# Patient Record
Sex: Male | Born: 1960 | Race: White | Hispanic: No | Marital: Married | Smoking: Never smoker
Health system: Southern US, Community
[De-identification: ages and names within clinical notes are randomized; demographics above are authoritative.]

## PROBLEM LIST (undated history)

## (undated) DIAGNOSIS — E785 Hyperlipidemia, unspecified: Secondary | ICD-10-CM

## (undated) DIAGNOSIS — N2889 Other specified disorders of kidney and ureter: Secondary | ICD-10-CM

## (undated) DIAGNOSIS — G4730 Sleep apnea, unspecified: Secondary | ICD-10-CM

## (undated) HISTORY — PX: VASECTOMY: SHX75

---

## 2012-03-29 ENCOUNTER — Other Ambulatory Visit (HOSPITAL_COMMUNITY): Payer: Self-pay | Admitting: Urology

## 2012-03-29 ENCOUNTER — Ambulatory Visit (HOSPITAL_COMMUNITY)
Admission: RE | Admit: 2012-03-29 | Discharge: 2012-03-29 | Disposition: A | Discharge status: Home / Self Care | Payer: BC Managed Care – PPO | Source: Ambulatory Visit | Attending: Urology | Admitting: Urology

## 2012-03-29 DIAGNOSIS — C649 Malignant neoplasm of unspecified kidney, except renal pelvis: Secondary | ICD-10-CM

## 2012-03-30 ENCOUNTER — Other Ambulatory Visit: Payer: Self-pay | Admitting: Urology

## 2012-04-28 ENCOUNTER — Encounter (HOSPITAL_COMMUNITY): Payer: Self-pay | Admitting: Pharmacy Technician

## 2012-05-02 NOTE — Pre-Procedure Instructions (Signed)
05-03-12 EKG 11'13-report with chart/ CXR 1'14 -Epic. W. Kennon Portela

## 2012-05-02 NOTE — Patient Instructions (Addendum)
20 [name]  05/02/2012   Your procedure is scheduled on:  3-13 -2014  Report to [facility] at   0800     AM.  Call this number if you have problems the morning of surgery: [phone]  Or Presurgical Testing [phone]([place])   Remember: Follow any bowel prep instructions per MD office.( Drink clear liquids plentiful) For Cpap use: Bring mask and tubing only.   Do not eat food:After Midnight.-05-10-12.    Clear liquids include soda, tea, black coffee, apple or grape juice, broth.(stop after midnight night befor e surgery).  Take these medicines the morning of surgery with A SIP OF WATER: None   Do not wear jewelry, make-up or nail polish.  Do not wear lotions, powders, or perfumes. You may wear deodorant.  Do not shave 12 hours prior to first CHG shower(legs and under arms).(face and neck okay.)  Do not bring valuables to the hospital.  Contacts, dentures or bridgework,body piercing,  may not be worn into surgery.  Leave suitcase in the car. After surgery it may be brought to your room.  For patients admitted to the hospital, checkout time is 11:00 AM the day of discharge.   Patients discharged the day of surgery will not be allowed to drive home. Must have responsible person with you x 24 hours once discharged.  Name and phone number of your driver: [phone] cell -Kelly-spouse  Special Instructions: CHG(Chlorhedine 4%-"Hibiclens","Betasept","Aplicare") Shower Use Special Wash: see special instructions.(avoid face and genitals)   Please read over the following fact sheets that you were given: MRSA Information, Blood Transfusion fact sheet, Incentive Spirometry Instruction.    Failure to follow these instructions may result in Cancellation of your surgery.   Patient signature_______________________________________________________    `,m

## 2012-05-03 ENCOUNTER — Encounter (HOSPITAL_COMMUNITY): Payer: Self-pay

## 2012-05-03 ENCOUNTER — Encounter (HOSPITAL_COMMUNITY)
Admission: RE | Admit: 2012-05-03 | Discharge: 2012-05-03 | Disposition: A | Discharge status: Home / Self Care | Payer: BC Managed Care – PPO | Source: Ambulatory Visit | Attending: Urology | Admitting: Urology

## 2012-05-03 DIAGNOSIS — G4730 Sleep apnea, unspecified: Secondary | ICD-10-CM

## 2012-05-03 HISTORY — DX: Hyperlipidemia, unspecified: E78.5

## 2012-05-03 HISTORY — DX: Sleep apnea, unspecified: G47.30

## 2012-05-03 HISTORY — DX: Other specified disorders of kidney and ureter: N28.89

## 2012-05-03 HISTORY — PX: ARTHROSCOPIC REPAIR ACL: SUR80

## 2012-05-03 HISTORY — PX: HERNIA REPAIR: SHX51

## 2012-05-03 LAB — CBC
HCT: 46 % (ref 39.0–52.0)
MCH: 31.4 pg (ref 26.0–34.0)
MCHC: 34.6 g/dL (ref 30.0–36.0)
MCV: 90.7 fL (ref 78.0–100.0)
Platelets: 235 10*3/uL (ref 150–400)
RDW: 13.2 % (ref 11.5–15.5)
WBC: 6.1 10*3/uL (ref 4.0–10.5)

## 2012-05-03 LAB — SURGICAL PCR SCREEN: Staphylococcus aureus: NEGATIVE

## 2012-05-03 LAB — BASIC METABOLIC PANEL
BUN: 13 mg/dL (ref 6–23)
CO2: 25 mEq/L (ref 19–32)
Calcium: 8.9 mg/dL (ref 8.4–10.5)
Chloride: 106 mEq/L (ref 96–112)
Creatinine, Ser: 0.95 mg/dL (ref 0.50–1.35)
Glucose, Bld: 89 mg/dL (ref 70–99)

## 2012-05-04 ENCOUNTER — Encounter (HOSPITAL_COMMUNITY): Payer: Self-pay

## 2012-05-11 NOTE — H&P (Signed)
Chief Complaint  Left renal neoplasm concerning for malignancy     History of Present Illness [name] is a 52 year old gentleman who is noted to have microscopic hematuria by Dr. Johnella Moloney prompting a urologic evaluation by Dr. Isabel Caprice in December. He has no history of gross hematuria and no history of tobacco use. He does have a family history of prostate cancer and apparently has been scheduled to undergo PSA testing by Dr. Isabel Caprice.   He did undergo a hematuria evaluation including a CT scan of the abdomen and pelvis with and without contrast. This demonstrated a 3 cm enhancing mass off the upper pole of the left kidney which was partially exophytic although mostly endophytic. This is suspicious for renal cell carcinoma until proven otherwise. There is no regional lymphadenopathy, no contralateral concerning renal masses, no adrenal abnormalities, no renal vein involvement, and no evidence of abdominal metastases. He has no family history of kidney cancer.  He has otherwise denied any dysuria, gross hematuria, urgency, frequency, history of GU malignancy/trauma/surgery. There is no family history of end-stage renal disease.   Past Medical History Problems  1. History of  Adult Sleep Apnea 780.57 2. History of  Hypercholesterolemia 272.0  Surgical History Problems  1. History of  Knee Surgery Left 2. History of  Laparoscopy Repair Of Hernia  Current Meds 1. Crestor TABS; Therapy: (Recorded:27Dec2013) to  Allergies Medication  1. No Known Drug Allergies  Family History Problems  1. Family history of  Father Deceased At Age 55 2. Family history of  Mother Deceased At Age 32 3. Family history of  Prostate Cancer V16.42  Social History Problems    Alcohol Use 1/day   Marital History - Currently Married   Never A Smoker  Review of Systems Constitutional, skin, eye, otolaryngeal, hematologic/lymphatic, cardiovascular, pulmonary, endocrine, musculoskeletal,  gastrointestinal, neurological and psychiatric system(s) were reviewed and pertinent findings if present are noted.      Physical Exam Constitutional: Well nourished and well developed . No acute distress.  ENT:. The ears and nose are normal in appearance.  Neck: The appearance of the neck is normal and no neck mass is present.  Pulmonary: No respiratory distress, normal respiratory rhythm and effort and clear bilateral breath sounds.  Cardiovascular: Heart rate and rhythm are normal . No peripheral edema.  Abdomen: The abdomen is soft and nontender. No masses are palpated. No CVA tenderness. No hernias are palpable.  An umbilical hernia is present, which is reducible. No hepatosplenomegaly noted.  Lymphatics: The supraclavicular, femoral and inguinal nodes are not enlarged or tender.  Skin: Normal skin turgor, no visible rash and no visible skin lesions.  Neuro/Psych:. Mood and affect are appropriate.     AU CT-HEMATURIA PROTOCOL 16Jan2014 12:00AM [name]   Test Name Result Flag Reference  ** RADIOLOGY REPORT BY [facility] **   *RADIOLOGY REPORT*  Clinical Data: Micro hematuria.  CT ABDOMEN AND PELVIS WITHOUT AND WITH CONTRAST  Technique: Multidetector CT imaging of the abdomen and pelvis was performed without contrast material in one or both body regions, followed by contrast material(s) and further sections in one or both body regions.  Contrast: 125 ml Isovue 300  Comparison: None.  Findings: No evidence of renal calculi or hydronephrosis. No evidence of ureteral calculi or dilatation. No bladder calculi identified.  Multi phasic postcontrast imaging shows a solid hypervascular mass in the upper pole of the left kidney which measures 3.0 cm, and shows no evidence of macroscopic fat. This is consistent  with a renal cell carcinoma. No other renal masses are identified. A simple small parapelvic cyst is noted the right kidney. Excretory phase shows  no masses or filling defects involving the intrarenal collecting systems, ureters, or urinary bladder.  No evidence of renal vein or IVC thrombus. No evidence of retroperitoneal lymphadenopathy. No adenopathy seen elsewhere within the abdomen or pelvis.  The liver, gallbladder, pancreas, spleen, and both adrenal glands are normal in appearance. Prostate and seminal vesicles are unremarkable. Surgical mesh seen in the lower anterior abdominal wall and suprapubic soft tissues. No evidence of inflammatory process or abnormal fluid collections. No evidence of recurrent hernia or dilated bowel loops. No suspicious bone lesions are identified. Visualized portions of lung bases are clear.  IMPRESSION:  1. 3.0 cm hypervascular mass in upper pole left kidney, consistent with renal cell carcinoma. Surgical evaluation is recommended. 2. No evidence of metastatic disease or urolithiasis.   Original Report Authenticated By: Myles Rosenthal, M.D.     Assessment Assessed  1. 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 Microscopic Hematuria 599.72   Discussion/Summary  1. Left renal neoplasm concerning for malignancy and microscopic hematuria:  He will undergo cystoscopy and a left robotic partial nephrectomy. I discussed the potential benefits and risks of the procedure, side effects of the proposed treatment, the likelihood of the patient achieving the goals of the procedure, and any potential problems that might occur during the procedure or recuperation.

## 2012-05-12 ENCOUNTER — Ambulatory Visit (HOSPITAL_COMMUNITY): Payer: BC Managed Care – PPO | Admitting: Registered Nurse

## 2012-05-12 ENCOUNTER — Inpatient Hospital Stay (HOSPITAL_COMMUNITY)
Admission: RE | Admit: 2012-05-12 | Discharge: 2012-05-14 | DRG: 303 | Disposition: A | Discharge status: Home / Self Care | Payer: BC Managed Care – PPO | Source: Ambulatory Visit | Attending: Urology | Admitting: Urology

## 2012-05-12 ENCOUNTER — Encounter (HOSPITAL_COMMUNITY): Payer: Self-pay | Admitting: Registered Nurse

## 2012-05-12 ENCOUNTER — Encounter (HOSPITAL_COMMUNITY)
Admission: RE | Disposition: A | Discharge status: Home / Self Care | Payer: Self-pay | Source: Ambulatory Visit | Attending: Urology

## 2012-05-12 ENCOUNTER — Encounter (HOSPITAL_COMMUNITY): Payer: Self-pay | Admitting: *Deleted

## 2012-05-12 DIAGNOSIS — C642 Malignant neoplasm of left kidney, except renal pelvis: Secondary | ICD-10-CM

## 2012-05-12 DIAGNOSIS — G4730 Sleep apnea, unspecified: Secondary | ICD-10-CM | POA: Diagnosis present

## 2012-05-12 DIAGNOSIS — C649 Malignant neoplasm of unspecified kidney, except renal pelvis: Principal | ICD-10-CM | POA: Diagnosis present

## 2012-05-12 DIAGNOSIS — R3129 Other microscopic hematuria: Secondary | ICD-10-CM | POA: Diagnosis present

## 2012-05-12 DIAGNOSIS — K429 Umbilical hernia without obstruction or gangrene: Secondary | ICD-10-CM | POA: Diagnosis present

## 2012-05-12 DIAGNOSIS — Z79899 Other long term (current) drug therapy: Secondary | ICD-10-CM

## 2012-05-12 DIAGNOSIS — E7800 Pure hypercholesterolemia, unspecified: Secondary | ICD-10-CM | POA: Diagnosis present

## 2012-05-12 HISTORY — PX: ROBOTIC ASSITED PARTIAL NEPHRECTOMY: SHX6087

## 2012-05-12 HISTORY — PX: CYSTOSCOPY: SHX5120

## 2012-05-12 LAB — BASIC METABOLIC PANEL
BUN: 12 mg/dL (ref 6–23)
CO2: 27 mEq/L (ref 19–32)
Calcium: 8.2 mg/dL — ABNORMAL LOW (ref 8.4–10.5)
Creatinine, Ser: 1.2 mg/dL (ref 0.50–1.35)

## 2012-05-12 LAB — HEMOGLOBIN AND HEMATOCRIT, BLOOD: Hemoglobin: 14.7 g/dL (ref 13.0–17.0)

## 2012-05-12 SURGERY — ROBOTIC ASSITED PARTIAL NEPHRECTOMY
Anesthesia: General | Wound class: Clean

## 2012-05-12 MED ORDER — NEOSTIGMINE METHYLSULFATE 1 MG/ML IJ SOLN
INTRAMUSCULAR | Status: DC | PRN
  Administered 2012-05-12: 5 mg via INTRAVENOUS

## 2012-05-12 MED ORDER — ZOLPIDEM TARTRATE 5 MG PO TABS
5.0000 mg | ORAL_TABLET | Freq: Every evening | ORAL | Status: DC | PRN
  Administered 2012-05-13: 5 mg via ORAL
  Filled 2012-05-12: qty 1

## 2012-05-12 MED ORDER — LIDOCAINE HCL (CARDIAC) 20 MG/ML IV SOLN
INTRAVENOUS | Status: DC | PRN
  Administered 2012-05-12: 100 mg via INTRAVENOUS

## 2012-05-12 MED ORDER — PROPOFOL 10 MG/ML IV BOLUS
INTRAVENOUS | Status: DC | PRN
  Administered 2012-05-12: 200 mg via INTRAVENOUS

## 2012-05-12 MED ORDER — HYDROMORPHONE HCL PF 1 MG/ML IJ SOLN
0.2500 mg | INTRAMUSCULAR | Status: DC | PRN
  Administered 2012-05-12 (×4): 0.5 mg via INTRAVENOUS

## 2012-05-12 MED ORDER — ONDANSETRON HCL 4 MG/2ML IJ SOLN
INTRAMUSCULAR | Status: DC | PRN
  Administered 2012-05-12: 4 mg via INTRAVENOUS

## 2012-05-12 MED ORDER — CEFAZOLIN SODIUM 1-5 GM-% IV SOLN
1.0000 g | Freq: Three times a day (TID) | INTRAVENOUS | Status: AC
  Administered 2012-05-12 – 2012-05-13 (×2): 1 g via INTRAVENOUS
  Filled 2012-05-12 (×2): qty 50

## 2012-05-12 MED ORDER — LACTATED RINGERS IV SOLN: INTRAVENOUS | Status: DC

## 2012-05-12 MED ORDER — DIPHENHYDRAMINE HCL 12.5 MG/5ML PO ELIX: 12.5000 mg | ORAL_SOLUTION | Freq: Four times a day (QID) | ORAL | Status: DC | PRN

## 2012-05-12 MED ORDER — ROSUVASTATIN CALCIUM 10 MG PO TABS
10.0000 mg | ORAL_TABLET | Freq: Every day | ORAL | Status: DC
  Administered 2012-05-12 – 2012-05-13 (×2): 10 mg via ORAL
  Filled 2012-05-12 (×3): qty 1

## 2012-05-12 MED ORDER — HYDROMORPHONE HCL PF 1 MG/ML IJ SOLN
INTRAMUSCULAR | Status: DC | PRN
  Administered 2012-05-12 (×4): 0.5 mg via INTRAVENOUS

## 2012-05-12 MED ORDER — DEXTROSE-NACL 5-0.45 % IV SOLN
INTRAVENOUS | Status: DC
  Administered 2012-05-12 – 2012-05-13 (×4): via INTRAVENOUS

## 2012-05-12 MED ORDER — LABETALOL HCL 5 MG/ML IV SOLN
INTRAVENOUS | Status: DC | PRN
  Administered 2012-05-12: 5 mg via INTRAVENOUS

## 2012-05-12 MED ORDER — BUPIVACAINE LIPOSOME 1.3 % IJ SUSP
20.0000 mL | Freq: Once | INTRAMUSCULAR | Status: DC
  Filled 2012-05-12: qty 20

## 2012-05-12 MED ORDER — FENTANYL CITRATE 0.05 MG/ML IJ SOLN
INTRAMUSCULAR | Status: DC | PRN
  Administered 2012-05-12 (×2): 100 ug via INTRAVENOUS
  Administered 2012-05-12: 50 ug via INTRAVENOUS

## 2012-05-12 MED ORDER — CEFAZOLIN SODIUM-DEXTROSE 2-3 GM-% IV SOLR
INTRAVENOUS | Status: AC
  Filled 2012-05-12: qty 50

## 2012-05-12 MED ORDER — ACETAMINOPHEN 10 MG/ML IV SOLN
INTRAVENOUS | Status: DC | PRN
  Administered 2012-05-12: 1000 mg via INTRAVENOUS

## 2012-05-12 MED ORDER — BUPIVACAINE-EPINEPHRINE PF 0.25-1:200000 % IJ SOLN
INTRAMUSCULAR | Status: AC
  Filled 2012-05-12: qty 30

## 2012-05-12 MED ORDER — LACTATED RINGERS IR SOLN
Status: DC | PRN
  Administered 2012-05-12: 1000 mL

## 2012-05-12 MED ORDER — ATORVASTATIN CALCIUM 20 MG PO TABS
20.0000 mg | ORAL_TABLET | Freq: Every day | ORAL | Status: DC
  Filled 2012-05-12: qty 1

## 2012-05-12 MED ORDER — ACETAMINOPHEN 10 MG/ML IV SOLN
INTRAVENOUS | Status: AC
  Filled 2012-05-12: qty 100

## 2012-05-12 MED ORDER — LACTATED RINGERS IV SOLN
INTRAVENOUS | Status: DC | PRN
  Administered 2012-05-12: 10:00:00 via INTRAVENOUS

## 2012-05-12 MED ORDER — MORPHINE SULFATE 2 MG/ML IJ SOLN
2.0000 mg | INTRAMUSCULAR | Status: DC | PRN
  Administered 2012-05-12 – 2012-05-13 (×3): 2 mg via INTRAVENOUS
  Filled 2012-05-12 (×3): qty 1

## 2012-05-12 MED ORDER — ACETAMINOPHEN 10 MG/ML IV SOLN
1000.0000 mg | Freq: Four times a day (QID) | INTRAVENOUS | Status: DC
  Administered 2012-05-12 – 2012-05-13 (×3): 1000 mg via INTRAVENOUS
  Filled 2012-05-12 (×4): qty 100

## 2012-05-12 MED ORDER — SODIUM CHLORIDE 0.9 % IJ SOLN
INTRAMUSCULAR | Status: DC | PRN
  Administered 2012-05-12: 12:00:00

## 2012-05-12 MED ORDER — HYDROMORPHONE HCL PF 1 MG/ML IJ SOLN
INTRAMUSCULAR | Status: AC
  Filled 2012-05-12: qty 2

## 2012-05-12 MED ORDER — MIDAZOLAM HCL 5 MG/5ML IJ SOLN
INTRAMUSCULAR | Status: DC | PRN
  Administered 2012-05-12: 2 mg via INTRAVENOUS

## 2012-05-12 MED ORDER — STERILE WATER FOR IRRIGATION IR SOLN
Status: DC | PRN
  Administered 2012-05-12: 3000 mL

## 2012-05-12 MED ORDER — GLYCOPYRROLATE 0.2 MG/ML IJ SOLN
INTRAMUSCULAR | Status: DC | PRN
  Administered 2012-05-12: .8 mg via INTRAVENOUS

## 2012-05-12 MED ORDER — ONDANSETRON HCL 4 MG/2ML IJ SOLN: 4.0000 mg | INTRAMUSCULAR | Status: DC | PRN

## 2012-05-12 MED ORDER — DIPHENHYDRAMINE HCL 50 MG/ML IJ SOLN: 12.5000 mg | Freq: Four times a day (QID) | INTRAMUSCULAR | Status: DC | PRN

## 2012-05-12 MED ORDER — MANNITOL 25 % IV SOLN
12.5000 g | Freq: Two times a day (BID) | INTRAVENOUS | Status: AC
  Administered 2012-05-12 (×2): 12.5 g via INTRAVENOUS
  Filled 2012-05-12 (×2): qty 50

## 2012-05-12 MED ORDER — ROCURONIUM BROMIDE 100 MG/10ML IV SOLN
INTRAVENOUS | Status: DC | PRN
  Administered 2012-05-12 (×2): 10 mg via INTRAVENOUS
  Administered 2012-05-12: 20 mg via INTRAVENOUS
  Administered 2012-05-12: 50 mg via INTRAVENOUS

## 2012-05-12 MED ORDER — CEFAZOLIN SODIUM-DEXTROSE 2-3 GM-% IV SOLR
2.0000 g | INTRAVENOUS | Status: AC
  Administered 2012-05-12: 2 g via INTRAVENOUS

## 2012-05-12 MED ORDER — DOCUSATE SODIUM 100 MG PO CAPS
100.0000 mg | ORAL_CAPSULE | Freq: Two times a day (BID) | ORAL | Status: DC
  Administered 2012-05-12 – 2012-05-14 (×3): 100 mg via ORAL
  Filled 2012-05-12 (×5): qty 1

## 2012-05-12 SURGICAL SUPPLY — 71 items
APPLICATOR SURGIFLO ENDO (HEMOSTASIS) ×3 IMPLANT
BAG URO CATCHER STRL LF (DRAPE) IMPLANT
CANNULA SEAL DVNC (CANNULA) IMPLANT
CANNULA SEALS DA VINCI (CANNULA)
CHLORAPREP W/TINT 26ML (MISCELLANEOUS) ×3 IMPLANT
CLIP LIGATING HEM O LOK PURPLE (MISCELLANEOUS) ×3 IMPLANT
CLIP LIGATING HEMO O LOK GREEN (MISCELLANEOUS) ×6 IMPLANT
CLOTH BEACON ORANGE TIMEOUT ST (SAFETY) ×3 IMPLANT
CORDS BIPOLAR (ELECTRODE) ×3 IMPLANT
COVER SURGICAL LIGHT HANDLE (MISCELLANEOUS) ×3 IMPLANT
COVER TIP SHEARS 8 DVNC (MISCELLANEOUS) ×2 IMPLANT
COVER TIP SHEARS 8MM DA VINCI (MISCELLANEOUS) ×1
DECANTER SPIKE VIAL GLASS SM (MISCELLANEOUS) IMPLANT
DERMABOND ADVANCED (GAUZE/BANDAGES/DRESSINGS) ×2
DERMABOND ADVANCED .7 DNX12 (GAUZE/BANDAGES/DRESSINGS) ×4 IMPLANT
DRAIN CHANNEL 15F RND FF 3/16 (WOUND CARE) ×3 IMPLANT
DRAPE CAMERA CLOSED 9X96 (DRAPES) IMPLANT
DRAPE INCISE IOBAN 66X45 STRL (DRAPES) ×3 IMPLANT
DRAPE LAPAROSCOPIC ABDOMINAL (DRAPES) ×3 IMPLANT
DRAPE LG THREE QUARTER DISP (DRAPES) ×6 IMPLANT
DRAPE TABLE BACK 44X90 PK DISP (DRAPES) ×3 IMPLANT
DRAPE WARM FLUID 44X44 (DRAPE) ×3 IMPLANT
DRESSING SURGICEL FIBRLLR 1X2 (HEMOSTASIS) ×2 IMPLANT
DRSG SURGICEL FIBRILLAR 1X2 (HEMOSTASIS) ×3
ELECT REM PT RETURN 9FT ADLT (ELECTROSURGICAL) ×6
ELECTRODE REM PT RTRN 9FT ADLT (ELECTROSURGICAL) ×4 IMPLANT
EVACUATOR SILICONE 100CC (DRAIN) ×3 IMPLANT
GAUZE VASELINE 3X9 (GAUZE/BANDAGES/DRESSINGS) IMPLANT
GLOVE BIO SURGEON STRL SZ 6.5 (GLOVE) ×3 IMPLANT
GLOVE BIOGEL M 6.5 STRL (GLOVE) ×6 IMPLANT
GLOVE BIOGEL M STRL SZ7.5 (GLOVE) ×9 IMPLANT
GLOVE BIOGEL PI IND STRL 7.0 (GLOVE) ×8 IMPLANT
GLOVE BIOGEL PI INDICATOR 7.0 (GLOVE) ×4
GLOVE SURG SS PI 7.0 STRL IVOR (GLOVE) ×12 IMPLANT
GOWN PREVENTION PLUS XLARGE (GOWN DISPOSABLE) IMPLANT
GOWN STRL NON-REIN LRG LVL3 (GOWN DISPOSABLE) ×9 IMPLANT
GOWN STRL REIN XL XLG (GOWN DISPOSABLE) ×12 IMPLANT
HEMOSTAT SURGICEL 4X8 (HEMOSTASIS) IMPLANT
KIT ACCESSORY DA VINCI DISP (KITS) ×1
KIT ACCESSORY DVNC DISP (KITS) ×2 IMPLANT
KIT BASIN OR (CUSTOM PROCEDURE TRAY) ×3 IMPLANT
MANIFOLD NEPTUNE II (INSTRUMENTS) IMPLANT
NS IRRIG 1000ML POUR BTL (IV SOLUTION) IMPLANT
PACK CYSTO (CUSTOM PROCEDURE TRAY) IMPLANT
PENCIL BUTTON HOLSTER BLD 10FT (ELECTRODE) ×3 IMPLANT
POSITIONER SURGICAL ARM (MISCELLANEOUS) ×6 IMPLANT
POUCH SPECIMEN RETRIEVAL 10MM (ENDOMECHANICALS) ×3 IMPLANT
SET TUBE IRRIG SUCTION NO TIP (IRRIGATION / IRRIGATOR) ×3 IMPLANT
SOLUTION ANTI FOG 6CC (MISCELLANEOUS) ×3 IMPLANT
SOLUTION ELECTROLUBE (MISCELLANEOUS) ×3 IMPLANT
SPONGE LAP 18X18 X RAY DECT (DISPOSABLE) IMPLANT
SURGIFLO W/THROMBIN 8M KIT (HEMOSTASIS) ×3 IMPLANT
SUT ETHILON 3 0 PS 1 (SUTURE) ×3 IMPLANT
SUT MNCRL AB 4-0 PS2 18 (SUTURE) ×6 IMPLANT
SUT V-LOC BARB 180 2/0GR6 GS22 (SUTURE) ×3
SUT V-LOC BARB 180 2/0GR9 GS23 (SUTURE)
SUT VIC AB 0 CT1 27 (SUTURE) ×1
SUT VIC AB 0 CT1 27XBRD ANTBC (SUTURE) ×2 IMPLANT
SUT VICRYL 0 UR6 27IN ABS (SUTURE) ×3 IMPLANT
SUT VLOC BARB 180 ABS3/0GR12 (SUTURE) ×3
SUTURE V-LC BRB 180 2/0GR6GS22 (SUTURE) ×2 IMPLANT
SUTURE V-LC BRB 180 2/0GR9GS23 (SUTURE) IMPLANT
SUTURE VLOC BRB 180 ABS3/0GR12 (SUTURE) ×2 IMPLANT
SYR BULB IRRIGATION 50ML (SYRINGE) IMPLANT
TOWEL OR NON WOVEN STRL DISP B (DISPOSABLE) ×3 IMPLANT
TRAY FOLEY CATH 14FRSI W/METER (CATHETERS) ×3 IMPLANT
TRAY LAP CHOLE (CUSTOM PROCEDURE TRAY) ×3 IMPLANT
TROCAR ENDOPATH XCEL 12X100 BL (ENDOMECHANICALS) ×3 IMPLANT
TROCAR XCEL 12X100 BLDLESS (ENDOMECHANICALS) ×3 IMPLANT
TUBING INSUFFLATION 10FT LAP (TUBING) ×3 IMPLANT
WATER STERILE IRR 1500ML POUR (IV SOLUTION) IMPLANT

## 2012-05-12 NOTE — Preoperative (Signed)
Beta Blockers   Reason not to administer Beta Blockers:Not Applicable 

## 2012-05-12 NOTE — Transfer of Care (Signed)
Immediate Anesthesia Transfer of Care Note  Patient: Ryan Lowery  Procedure(s) Performed: Procedure(s) with comments: ROBOTIC ASSITED PARTIAL NEPHRECTOMY (Left) - 3 HRS NEEDS MANNITOL, LAPAROSCOPIC ULTRASOUND  [phone] BCBS CYSTOSCOPY FLEXIBLE (N/A)  Patient Location: PACU  Anesthesia Type:General  Level of Consciousness: awake, alert , oriented and patient cooperative  Airway & Oxygen Therapy: Patient Spontanous Breathing and Patient connected to face mask oxygen  Post-op Assessment: Report given to PACU RN, Post -op Vital signs reviewed and stable and Patient moving all extremities  Post vital signs: Reviewed and stable  Complications: No apparent anesthesia complications

## 2012-05-12 NOTE — Anesthesia Preprocedure Evaluation (Addendum)
Anesthesia Evaluation  Patient identified by MRN, date of birth, ID band Patient awake    Reviewed: Allergy & Precautions, H&P , NPO status , Patient's Chart, lab work & pertinent test results  Airway Mallampati: III TM Distance: >3 FB Neck ROM: full    Dental no notable dental hx. (+) Dental Advisory Given and Teeth Intact Bonding 2 front upper:   Pulmonary neg pulmonary ROS, sleep apnea and Continuous Positive Airway Pressure Ventilation ,  breath sounds clear to auscultation  Pulmonary exam normal       Cardiovascular Exercise Tolerance: Good negative cardio ROS  Rhythm:regular Rate:Normal     Neuro/Psych negative neurological ROS  negative psych ROS   GI/Hepatic negative GI ROS, Neg liver ROS,   Endo/Other  negative endocrine ROS  Renal/GU negative Renal ROS  negative genitourinary   Musculoskeletal   Abdominal   Peds  Hematology negative hematology ROS (+)   Anesthesia Other Findings   Reproductive/Obstetrics negative OB ROS                        Anesthesia Physical Anesthesia Plan  ASA: III  Anesthesia Plan: General   Post-op Pain Management:    Induction: Intravenous  Airway Management Planned: Oral ETT  Additional Equipment:   Intra-op Plan:   Post-operative Plan: Extubation in OR  Informed Consent: I have reviewed the patients History and Physical, chart, labs and discussed the procedure including the risks, benefits and alternatives for the proposed anesthesia with the patient or authorized representative who has indicated his/her understanding and acceptance.   Dental Advisory Given  Plan Discussed with: CRNA and Surgeon  Anesthesia Plan Comments:         Anesthesia Quick Evaluation

## 2012-05-12 NOTE — Anesthesia Postprocedure Evaluation (Signed)
  Anesthesia Post-op Note  Patient: Ryan Lowery  Procedure(s) Performed: Procedure(s) (LRB): ROBOTIC ASSITED PARTIAL NEPHRECTOMY (Left) CYSTOSCOPY FLEXIBLE (N/A)  Patient Location: PACU  Anesthesia Type: General  Level of Consciousness: awake and alert   Airway and Oxygen Therapy: Patient Spontanous Breathing  Post-op Pain: mild  Post-op Assessment: Post-op Vital signs reviewed, Patient's Cardiovascular Status Stable, Respiratory Function Stable, Patent Airway and No signs of Nausea or vomiting  Last Vitals:  Filed Vitals:   05/12/12 1534  BP: 146/90  Pulse: 72  Temp: 36.6 C  Resp: 14    Post-op Vital Signs: stable   Complications: No apparent anesthesia complications

## 2012-05-12 NOTE — Op Note (Signed)
Preoperative diagnosis: Left renal neoplasm, hematuria  Postoperative diagnosis: Left renal neoplasm, hematuria  Procedure:  1. Left robotic-assisted laparoscopic partial nephrectomy 2. Intraoperative renal ultrasonography 3. Flexible cystoscopy  Surgeon: Moody Bruins. M.D.  Assistant(s): Pecola Leisure, PA-C  Anesthesia: General  Complications: None  EBL: 250 mL  IVF:  2800 mL crystalloid  Specimens: 1. Left renal neoplasm 2. Perinephric fat  Disposition of specimens: Pathology  Intraoperative findings:       1. Warm renal ischemia time: 19 minutes       2. Intraoperative renal ultrasound findings: A 2.8 cm solid mass was identified in the upper pole of the kidney consistent with preoperative CT imaging findings and concerning for a renal malignancy.  Drains: 1. # 15 Blake perinephric drain  Indication:  Ryan Lowery is a 52 y.o. year old patient with a left renal neoplasm and microscopic hematuria.  After a thorough review of the management options for their renal mass, they elected to proceed with surgical treatment and the above procedure.  We have discussed the potential benefits and risks of the procedure, side effects of the proposed treatment, the likelihood of the patient achieving the goals of the procedure, and any potential problems that might occur during the procedure or recuperation. Informed consent has been obtained.   Description of procedure:  The patient was taken to the operating room and a general anesthetic was administered. The patient was given preoperative antibiotics, placed in the supine position. Next a preoperative timeout was performed.  After the patient was prepped and draped in the usual sterile fashion, flexible cystourethroscopy was performed.  This revealed a normal urethra. Systematic examination of the bladder revealed no evidence for tumors, stones, or other mucosal pathology.  He was then repositioned in the left  modified flank position with care to pad all potential pressure points, and prepped and draped in the usual sterile fashion.  A site was selected on the left side of the umbilicus for placement of the camera port. This was placed using a standard open Hassan technique which allowed entry into the peritoneal cavity under direct vision and without difficulty. A 12 mm port was placed and a pneumoperitoneum established. The camera was then used to inspect the abdomen and there was no evidence of any intra-abdominal injuries or other abnormalities. The remaining abdominal ports were then placed. 8 mm robotic ports were placed in the left upper quadrant, left lower quadrant, and far left lateral abdominal wall. A 12 mm port was placed in the upper midline for laparoscopic assistance. All ports were placed under direct vision without difficulty. The surgical cart was then docked.   Utilizing the cautery scissors, the white line of Toldt was incised allowing the colon to be mobilized medially and the plane between the mesocolon and the anterior layer of Gerota's fascia to be developed and the kidney to be exposed.  The ureter and gonadal vein were identified inferiorly and the ureter was lifted anteriorly off the psoas muscle.  Dissection proceeded superiorly along the gonadal vein until the renal vein was identified.  The renal hilum was then carefully isolated with a combination of blunt and sharp dissection allowing the renal arterial and venous structures to be separated and isolated in preparation for renal hilar vessel clamping. There was a single renal artery and renal vein.  12.5 g of IV mannitol was then administered.   Attention turned to the kidney and the perinephric fat surrounding the renal mass was removed and the kidney  was mobilized sufficiently for exposure and resection of the renal mass. The perinephric fat was quite adherent to the renal capsule which added extra time to this  dissection.  Intraoperative renal ultrasonography was utilized with the laparoscopic ultrasound probe to identify the renal tumor and identify the tumor margins. The margins of the tumor were carefully marked out with cautery.  Once the renal mass was properly isolated, preparations were made for resection of the tumor.  Reconstructive sutures were placed into the abdomen for the renorrhaphy portion of the procedure.  The renal artery was then clamped with bulldog clamps.  The tumor was then excised with cold scissor dissection along with an adequate visible gross margin of normal renal parenchyma. The tumor appeared to be excised without any gross violation of the tumor. The renal collecting system was entered during removal of the tumor.  A running 3-0 V-lock suture was then brought through the capsule of the kidney and run along the base of the renal defect to provide hemostasis and close any entry into the renal collecting system. Weck clips were used to secure this suture outside the renal capsule at the proximal and distal ends. An additional hemostatic agent (Surgiflo) was then placed into the renal defect. A running 2-0 V lock suture was then used to close the renal capsule using a sliding clip technique which resulted in excellent compression of the renal defect.    The bulldog clamps were then removed from the renal hilar vessel(s) and an additional 12.5 g of IV mannitol was administered. Total warm renal ischemia time was 19 minutes. The renal tumor resection site was examined. Hemostasis appeared adequate.   The kidney was placed back into its normal anatomic position and covered with perinephric fat as needed.  A # 15 Blake drain was then brought through the lateral lower port site and positioned in the perinephric space.  It was secured to the skin with a nylon suture. The surgical cart was undocked.  The renal tumor specimen was removed intact within an endopouch retrieval bag via the camera  port sites.  The camera port site and the other 12 mm port site were then closed at the fascial level with 0-vicryl suture.  All other laparoscopic/robotic ports were removed under direct vision and the pneumoperitoneum let down with inspection of the operative field performed and hemostasis again confirmed. All incision sites were then injected with local anesthetic and reapproximated at the skin level with 4-0 monocryl subcuticular closures.  Dermabond was applied to the skin.  The patient tolerated the procedure well and without complications.  The patient was able to be extubated and transferred to the recovery unit in satisfactory condition.  Moody Bruins MD

## 2012-05-13 ENCOUNTER — Encounter (HOSPITAL_COMMUNITY): Payer: Self-pay | Admitting: Urology

## 2012-05-13 LAB — CREATININE, FLUID (PLEURAL, PERITONEAL, JP DRAINAGE): Creat, Fluid: 1.1 mg/dL

## 2012-05-13 LAB — BASIC METABOLIC PANEL
BUN: 7 mg/dL (ref 6–23)
CO2: 28 mEq/L (ref 19–32)
Chloride: 103 mEq/L (ref 96–112)
Creatinine, Ser: 1.13 mg/dL (ref 0.50–1.35)
GFR calc Af Amer: 85 mL/min — ABNORMAL LOW (ref >90)
Glucose, Bld: 127 mg/dL — ABNORMAL HIGH (ref 70–99)

## 2012-05-13 MED ORDER — HYDROCODONE-ACETAMINOPHEN 5-325 MG PO TABS
1.0000 | ORAL_TABLET | Freq: Four times a day (QID) | ORAL | Status: DC | PRN
  Administered 2012-05-13 – 2012-05-14 (×4): 1 via ORAL
  Filled 2012-05-13 (×4): qty 1

## 2012-05-13 MED ORDER — HYDROCODONE-ACETAMINOPHEN 5-325 MG PO TABS: 1.0000 | ORAL_TABLET | Freq: Four times a day (QID) | ORAL | Status: AC | PRN

## 2012-05-13 MED ORDER — DOCUSATE SODIUM 100 MG PO CAPS
100.0000 mg | ORAL_CAPSULE | Freq: Two times a day (BID) | ORAL | Status: DC
  Administered 2012-05-13: 100 mg via ORAL
  Filled 2012-05-13 (×4): qty 1

## 2012-05-13 MED ORDER — BISACODYL 10 MG RE SUPP
10.0000 mg | Freq: Once | RECTAL | Status: AC
  Administered 2012-05-13: 10 mg via RECTAL
  Filled 2012-05-13: qty 1

## 2012-05-13 MED ORDER — DSS 100 MG PO CAPS: 100.0000 mg | ORAL_CAPSULE | Freq: Two times a day (BID) | ORAL | Status: AC

## 2012-05-13 NOTE — Progress Notes (Signed)
Patient ID: Ryan Lowery, male   DOB: Sep 28, 1960, 52 y.o.   MRN: [id number]  Pt doing well.  Ambulating and tolerating diet.  He has had flatus.  Will saline lock IV and send drain fluid for creatinine.  Plan for D/C in AM.  Pathology is pending.

## 2012-05-13 NOTE — Progress Notes (Signed)
   CARE MANAGEMENT NOTE 05/13/2012  Patient:  Ryan Lowery, Ryan Lowery   Account Number:  [id number]  Date Initiated:  05/13/2012  Documentation initiated by:  Jiles Crocker  Subjective/Objective Assessment:   ADMITTED FOR SURGERY - Left robotic-assisted laparoscopic partial nephrectomy     Action/Plan:   LIVES AT HOME WITH SPOUSE; CM FOLLOWING FOR DCP   Anticipated DC Date:  05/20/2012   Anticipated DC Plan:  HOME/SELF CARE      DC Planning Services  CM consult       Status of service:  In process, will continue to follow Medicare Important Message given?  NA - LOS <3 / Initial given by admissions (If response is "NO", the following Medicare IM given date fields will be blank) Per UR Regulation:  Reviewed for med. necessity/level of care/duration of stay Comments:  05/13/2012- B CHANDLER RN,BSN,MHA

## 2012-05-13 NOTE — Progress Notes (Signed)
Patient ID: Ryan Lowery, male   DOB: May 16, 1960, 52 y.o.   MRN: [id number]  1 Day Post-Op Subjective: The patient is doing well.  No nausea or vomiting. Pain is adequately controlled.  Objective: Vital signs in last 24 hours: Temp:  [97.3 F (36.3 C)-98.7 F (37.1 C)] 98.7 F (37.1 C) (03/14 0608) Pulse Rate:  [72-84] 73 (03/14 0608) Resp:  [10-18] 16 (03/14 0608) BP: (137-163)/(77-112) 140/83 mmHg (03/14 0608) SpO2:  [98 %-100 %] 100 % (03/14 0608) Weight:  [88.16 kg (194 lb 5.7 oz)] 88.16 kg (194 lb 5.7 oz) (03/13 1603)  Intake/Output from previous day: 03/13 0701 - 03/14 0700 In: 4097.5 [I.V.:3692.5; IV Piggyback:150] Out: 3425 [Urine:3025; Drains:150; Blood:250] Intake/Output this shift: Total I/O In: 195 [Other:195] Out: 2520 [Urine:2450; Drains:70]  Physical Exam:  General: Alert and oriented. CV: RRR Lungs: Clear bilaterally. GI: Soft, Nondistended. Incisions: Clean and dry. Urine: Clear Extremities: Nontender, no erythema, no edema.  Lab Results:  Recent Labs  05/12/12 1434 05/13/12 0503  HGB 14.7 15.2  HCT 43.9 45.1          Recent Labs  05/12/12 1434 05/13/12 0503  CREATININE 1.20 1.13           Results for orders placed during the hospital encounter of 05/12/12 (from the past 24 hour(s))  BASIC METABOLIC PANEL     Status: Abnormal   Collection Time    05/12/12  2:34 PM      Result Value Range   Sodium 137  135 - 145 mEq/L   Potassium 4.5  3.5 - 5.1 mEq/L   Chloride 103  96 - 112 mEq/L   CO2 27  19 - 32 mEq/L   Glucose, Bld 115 (*) 70 - 99 mg/dL   BUN 12  6 - 23 mg/dL   Creatinine, Ser 4.09  0.50 - 1.35 mg/dL   Calcium 8.2 (*) 8.4 - 10.5 mg/dL   GFR calc non Af Amer 68 (*) >90 mL/min   GFR calc Af Amer 79 (*) >90 mL/min  HEMOGLOBIN AND HEMATOCRIT, BLOOD     Status: None   Collection Time    05/12/12  2:34 PM      Result Value Range   Hemoglobin 14.7  13.0 - 17.0 g/dL   HCT 81.1  91.4 - 78.2 %  BASIC METABOLIC PANEL     Status:  Abnormal   Collection Time    05/13/12  5:03 AM      Result Value Range   Sodium 138  135 - 145 mEq/L   Potassium 3.7  3.5 - 5.1 mEq/L   Chloride 103  96 - 112 mEq/L   CO2 28  19 - 32 mEq/L   Glucose, Bld 127 (*) 70 - 99 mg/dL   BUN 7  6 - 23 mg/dL   Creatinine, Ser 9.56  0.50 - 1.35 mg/dL   Calcium 8.2 (*) 8.4 - 10.5 mg/dL   GFR calc non Af Amer 74 (*) >90 mL/min   GFR calc Af Amer 85 (*) >90 mL/min  HEMOGLOBIN AND HEMATOCRIT, BLOOD     Status: None   Collection Time    05/13/12  5:03 AM      Result Value Range   Hemoglobin 15.2  13.0 - 17.0 g/dL   HCT 21.3  08.6 - 57.8 %    Assessment/Plan: POD# 1 s/p robotic partial nephrectomy.  1) Ambulate, Incentive spirometry 2) Advance diet as tolerated 3) Transition to oral pain medication 4) Dulcolax suppository 5)  D/C urethral catheter   Moody Bruins. MD   LOS: 1 day   BORDEN,LES 05/13/2012, 6:57 AM

## 2012-05-14 LAB — BASIC METABOLIC PANEL
CO2: 26 mEq/L (ref 19–32)
Chloride: 101 mEq/L (ref 96–112)
Sodium: 134 mEq/L — ABNORMAL LOW (ref 135–145)

## 2012-05-14 MED ORDER — ZOLPIDEM TARTRATE 5 MG PO TABS: 5.0000 mg | ORAL_TABLET | Freq: Every evening | ORAL | Status: AC | PRN

## 2012-05-14 NOTE — Discharge Summary (Signed)
Physician Discharge Summary  Patient ID: Ryan Lowery MRN: [id number] DOB/AGE: [birth year] 52 y.o.  Admit date: 05/12/2012 Discharge date: 05/14/2012  Admission Diagnoses: Left renal mass  Discharge Diagnoses: Same Active Problems:   Left renal mass   Discharged Condition: good  Hospital Course: [name] had a left partial nephrectomy on 3/13 for a 4cm left renal mass that was suspicious for renal cell carcinoma.  His postop course has been uneventful.  He is tolerating a diet.  He has not had a BM.  He is voiding well.  His JP was removed this morning when the creatinine returned 1.1 on the drain fluid.   Consults: None  Significant Diagnostic Studies: None  Treatments: surgery: Robotic left partial nephrectomy and cystoscopy  Discharge Exam: Blood pressure 157/93, pulse 93, temperature 98.3 F (36.8 C), temperature source Oral, resp. rate 20, height 6\' 1"  (1.854 m), weight 88.16 kg (194 lb 5.7 oz), SpO2 97.00%. General appearance: alert and no distress Resp: clear to auscultation bilaterally Cardio: regular rate and rhythm GI: Soft, flat NT with +BS.  incisions are intact.  There is an umbililcal hernia.   Disposition: home  Discharge Orders   Future Orders Complete By Expires     Discontinue IV  As directed         Medication List    STOP taking these medications       TYLENOL PM EXTRA STRENGTH 25-500 MG Tabs  Generic drug:  diphenhydramine-acetaminophen      TAKE these medications       DSS 100 MG Caps  Take 100 mg by mouth 2 (two) times daily.     HYDROcodone-acetaminophen 5-325 MG per tablet  Commonly known as:  NORCO/VICODIN  Take 1-2 tablets by mouth every 6 (six) hours as needed.     rosuvastatin 10 MG tablet  Commonly known as:  CRESTOR  Take 10 mg by mouth every evening.     zolpidem 5 MG tablet  Commonly known as:  AMBIEN  Take 1 tablet (5 mg total) by mouth at bedtime as needed for sleep.           Follow-up Information   Follow up  with Crecencio Mc, MD On 06/02/2012. (at 4:00)    Contact information:   [address] [place] [address] [phone]       Signed: Anner Crete 05/14/2012, 10:52 AM

## 2013-06-01 ENCOUNTER — Other Ambulatory Visit (HOSPITAL_COMMUNITY): Payer: Self-pay | Admitting: Urology

## 2013-06-01 ENCOUNTER — Ambulatory Visit (HOSPITAL_COMMUNITY)
Admission: RE | Admit: 2013-06-01 | Discharge: 2013-06-01 | Disposition: A | Discharge status: Home / Self Care | Payer: BC Managed Care – PPO | Source: Ambulatory Visit | Attending: Urology | Admitting: Urology

## 2013-06-01 DIAGNOSIS — D3000 Benign neoplasm of unspecified kidney: Secondary | ICD-10-CM | POA: Insufficient documentation

## 2014-08-21 ENCOUNTER — Other Ambulatory Visit: Payer: Self-pay | Admitting: Internal Medicine

## 2014-08-21 DIAGNOSIS — R1030 Lower abdominal pain, unspecified: Secondary | ICD-10-CM

## 2014-08-22 ENCOUNTER — Ambulatory Visit
Admission: RE | Admit: 2014-08-22 | Discharge: 2014-08-22 | Disposition: A | Discharge status: Home / Self Care | Payer: BLUE CROSS/BLUE SHIELD | Source: Ambulatory Visit | Attending: Internal Medicine | Admitting: Internal Medicine

## 2014-08-22 DIAGNOSIS — R1030 Lower abdominal pain, unspecified: Secondary | ICD-10-CM

## 2014-08-22 MED ORDER — IOPAMIDOL (ISOVUE-300) INJECTION 61%
100.0000 mL | Freq: Once | INTRAVENOUS | Status: AC | PRN
  Administered 2014-08-22: 100 mL via INTRAVENOUS

## 2016-06-15 IMAGING — CT CT ABD-PELV W/ CM
3 of 5 series · 13 of 36 positions shown, 19 images · IV contrast (READICAT/WATER & [ID] ISOVUE 300)
Comparison: 06/05/2013

CLINICAL DATA: BILATERAL mid and lower abdominal pain for 2 weeks,
remote ventral hernia pair with mesh 20 years ago, prior resection
of a benign renal lesion, hyperlipidemia

EXAM:
CT ABDOMEN AND PELVIS WITH CONTRAST
TECHNIQUE: Multidetector CT imaging of the abdomen and pelvis was performed
using the standard protocol following bolus administration of
intravenous contrast. Sagittal and coronal MPR images reconstructed
from axial data set.
CONTRAST:  100mL [id number] IOPAMIDOL ([id number]) INJECTION 61% IV.
Dilute oral contrast.

[Series 3: abd/pelvis with · axial · 0.78mm/px · z∈[-409,-24]mm · 8 of 99 slices shown, 13 images]
[im 11/99  soft-tissue]
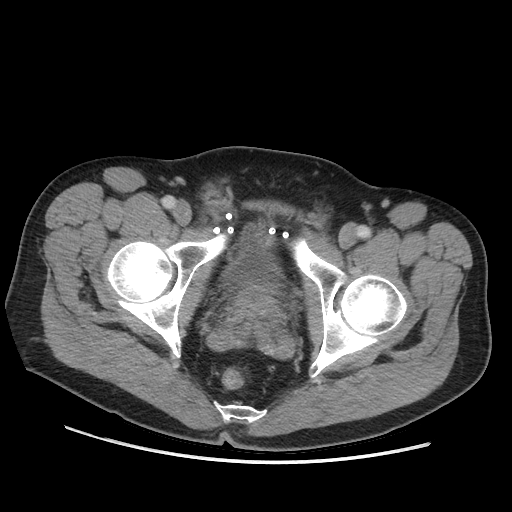
[im 11/99  bone]
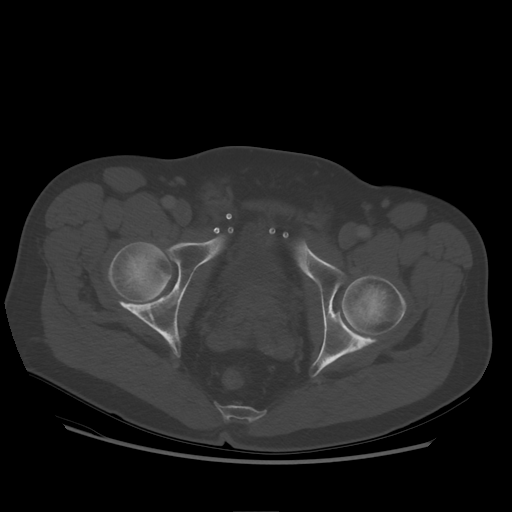
[im 22/99  soft-tissue]
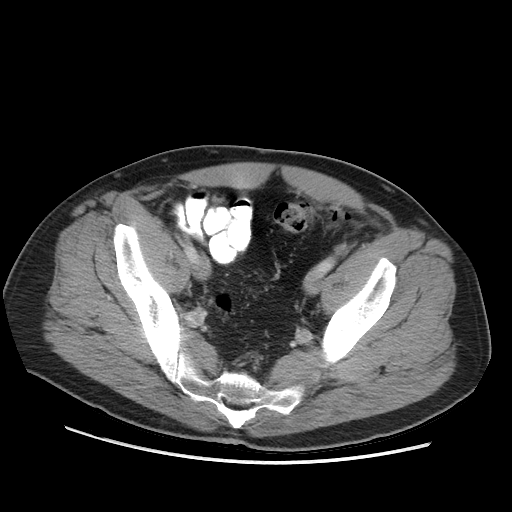
[im 33/99  soft-tissue]
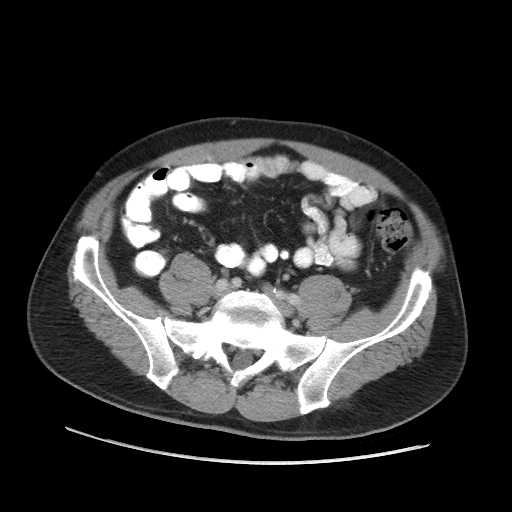
[im 44/99  soft-tissue]
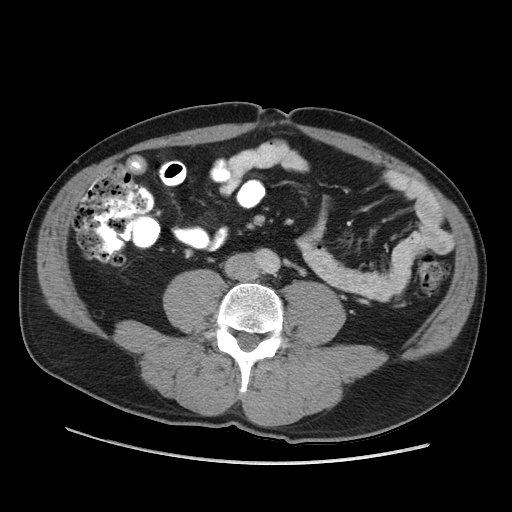
[im 55/99  soft-tissue]
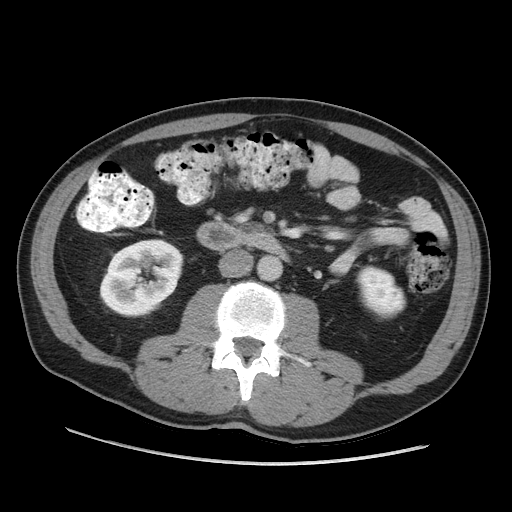
[im 55/99  lung]
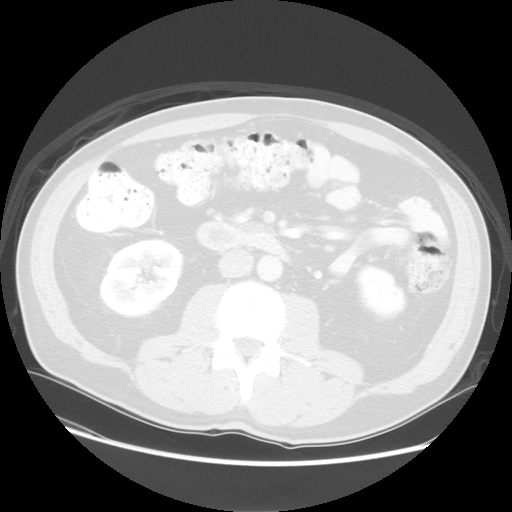
[im 66/99  soft-tissue]
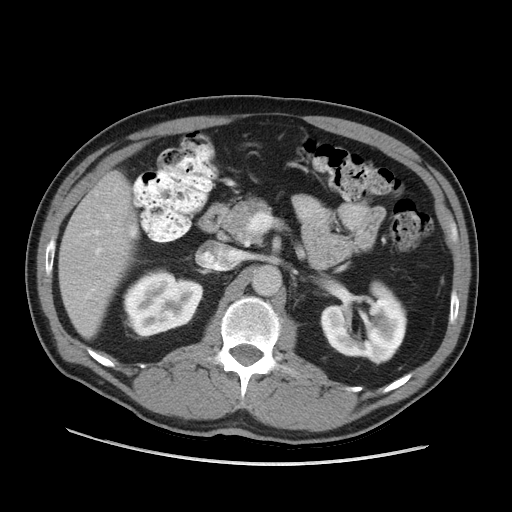
[im 66/99  lung]
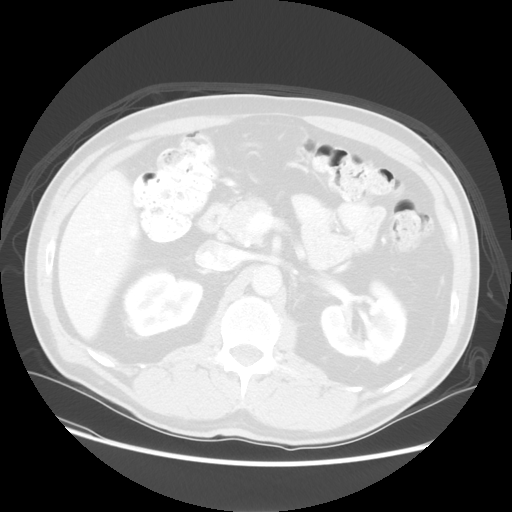
[im 77/99  soft-tissue]
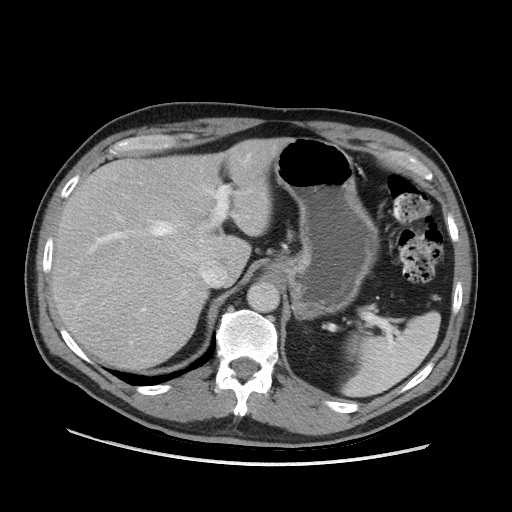
[im 77/99  lung]
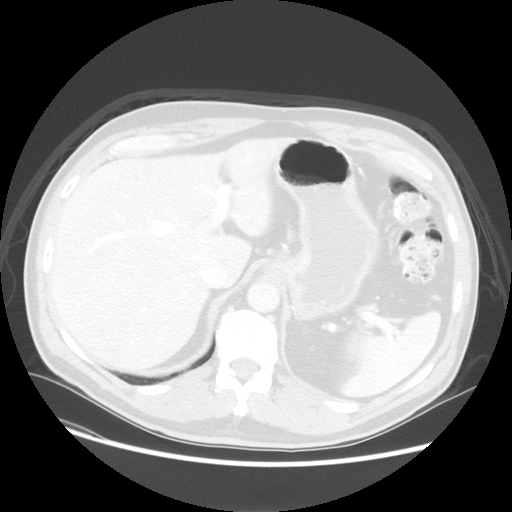
[im 88/99  soft-tissue]
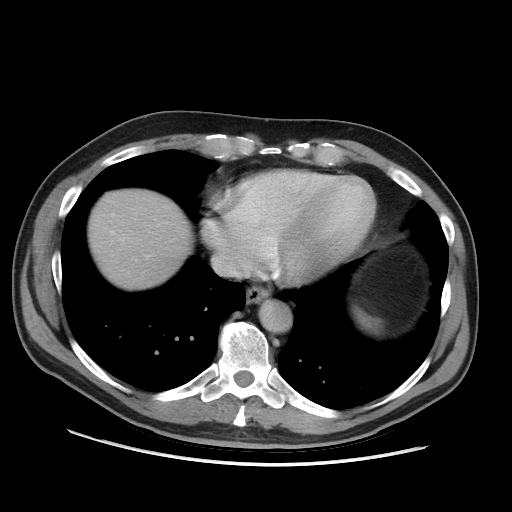
[im 88/99  lung]
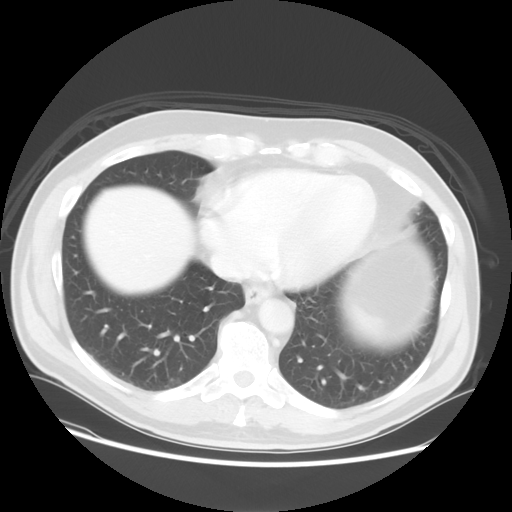

[Series 601: coronal body · coronal · 0.98mm/px · 1 of 114 slices shown, 2 images]
[im 38/114  soft-tissue]
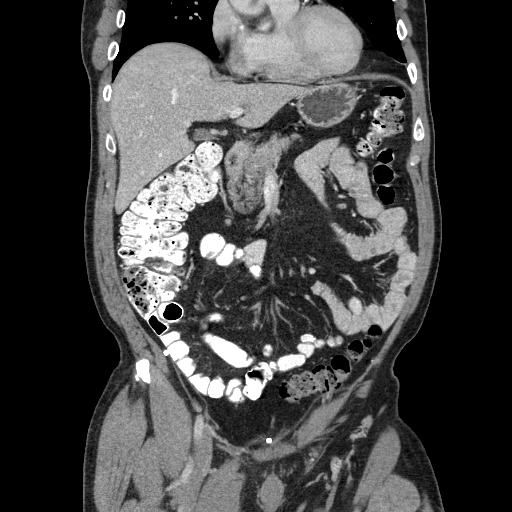
[im 38/114  bone]
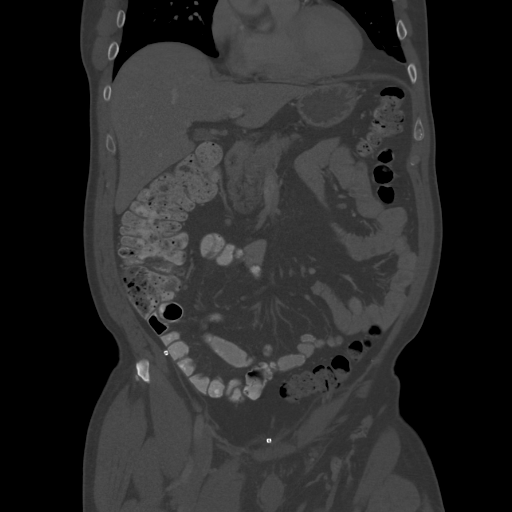

[Series 602: sagittal body · sagittal · 0.98mm/px · 4 of 155 slices shown]
[im 11/155  soft-tissue]
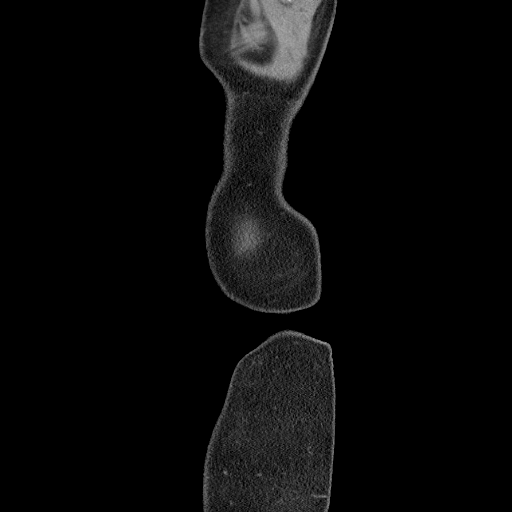
[im 31/155  soft-tissue]
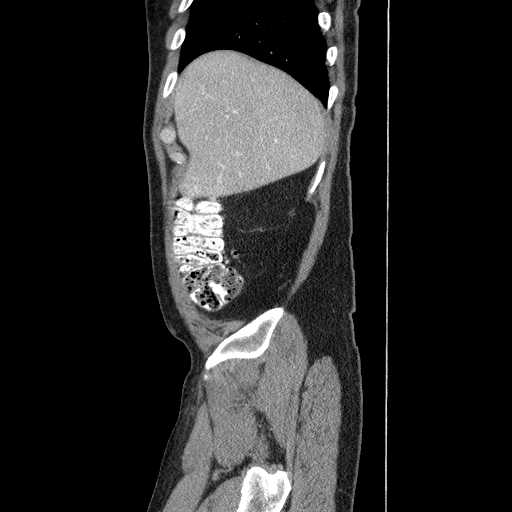
[im 52/155  soft-tissue]
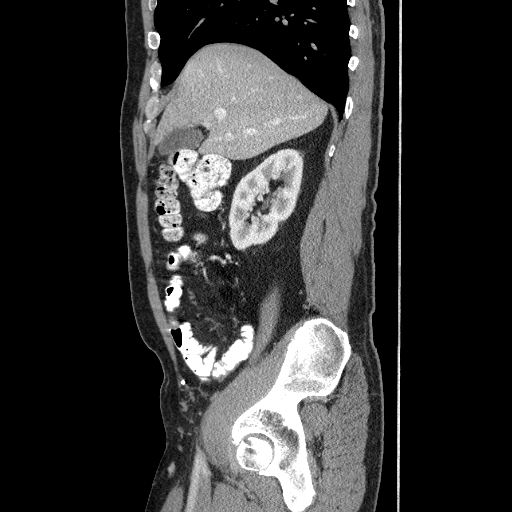
[im 72/155  soft-tissue]
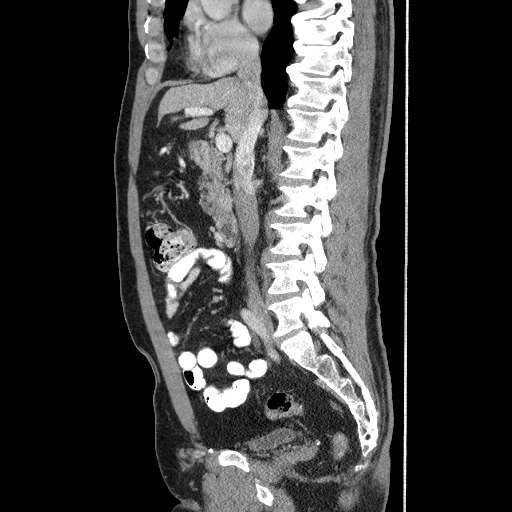

[13 of 36 positions shown; findings below may reference images not displayed]

FINDINGS: Lung bases clear.

Scarring at upper pole LEFT kidney.

Small peripelvic RIGHT renal cyst again identified, 19 x 10 mm image
16.

Liver, gallbladder, spleen, pancreas, kidneys, and adrenal glands
otherwise normal.

Normal appendix.

Bladder and ureters normal appearance.

Stomach and bowel loops normal appearance.

No mass, adenopathy, free fluid, free air, or inflammatory process.

Tiny umbilical hernia containing fat.

No acute osseous findings.
IMPRESSION: Small peripelvic RIGHT renal cyst.

Tiny umbilical hernia containing fat.

No acute intra-abdominal or intrapelvic abnormalities.
